# Patient Record
Sex: Female | Born: 1980 | Race: White | Hispanic: No | Marital: Single | Smoking: Never smoker
Health system: Southern US, Community
[De-identification: ages and names within clinical notes are randomized; demographics above are authoritative.]

## PROBLEM LIST (undated history)

## (undated) ENCOUNTER — Inpatient Hospital Stay (HOSPITAL_COMMUNITY): Payer: Self-pay

## (undated) DIAGNOSIS — D649 Anemia, unspecified: Secondary | ICD-10-CM

## (undated) DIAGNOSIS — N390 Urinary tract infection, site not specified: Secondary | ICD-10-CM

## (undated) DIAGNOSIS — R87629 Unspecified abnormal cytological findings in specimens from vagina: Secondary | ICD-10-CM

## (undated) HISTORY — DX: Urinary tract infection, site not specified: N39.0

## (undated) HISTORY — PX: NO PAST SURGERIES: SHX2092

---

## 2014-04-17 DIAGNOSIS — R87629 Unspecified abnormal cytological findings in specimens from vagina: Secondary | ICD-10-CM

## 2014-04-17 HISTORY — DX: Unspecified abnormal cytological findings in specimens from vagina: R87.629

## 2015-04-01 LAB — OB RESULTS CONSOLE ABO/RH: RH TYPE: POSITIVE

## 2015-04-01 LAB — OB RESULTS CONSOLE RPR: RPR: NONREACTIVE

## 2015-04-01 LAB — OB RESULTS CONSOLE GC/CHLAMYDIA
Chlamydia: NEGATIVE
Gonorrhea: NEGATIVE

## 2015-04-01 LAB — OB RESULTS CONSOLE HIV ANTIBODY (ROUTINE TESTING): HIV: NONREACTIVE

## 2015-04-01 LAB — OB RESULTS CONSOLE ANTIBODY SCREEN: ANTIBODY SCREEN: NEGATIVE

## 2015-04-01 LAB — OB RESULTS CONSOLE RUBELLA ANTIBODY, IGM: RUBELLA: IMMUNE

## 2015-04-01 LAB — OB RESULTS CONSOLE HEPATITIS B SURFACE ANTIGEN: HEP B S AG: NEGATIVE

## 2015-04-16 ENCOUNTER — Ambulatory Visit (HOSPITAL_COMMUNITY)
Admission: RE | Admit: 2015-04-16 | Discharge: 2015-04-16 | Disposition: A | Discharge status: Home / Self Care | Payer: Medicaid Other | Source: Ambulatory Visit | Attending: Nurse Practitioner | Admitting: Nurse Practitioner

## 2015-04-16 ENCOUNTER — Other Ambulatory Visit (HOSPITAL_COMMUNITY): Payer: Self-pay | Admitting: Nurse Practitioner

## 2015-04-16 ENCOUNTER — Encounter (HOSPITAL_COMMUNITY): Payer: Self-pay

## 2015-04-16 DIAGNOSIS — Z3A12 12 weeks gestation of pregnancy: Secondary | ICD-10-CM

## 2015-04-16 DIAGNOSIS — Z3A13 13 weeks gestation of pregnancy: Secondary | ICD-10-CM | POA: Diagnosis not present

## 2015-04-16 DIAGNOSIS — Z3682 Encounter for antenatal screening for nuchal translucency: Secondary | ICD-10-CM

## 2015-04-16 DIAGNOSIS — Z36 Encounter for antenatal screening of mother: Secondary | ICD-10-CM | POA: Insufficient documentation

## 2015-04-18 NOTE — L&D Delivery Note (Signed)
Delivery Note At 5:14 PM a viable female was delivered via  (Presentation: ROA).  APGAR: 8, 9; weight pending Placenta status: Intact, Spontaneous.  Cord:  with the following complications: none.  Anesthesia:  epidural Episiotomy:  none Lacerations: 1st degree;Labial Suture Repair: none Est. Blood Loss (mL):  100mL  Mom to postpartum.  Baby to Couplet care / Skin to Skin.    Loni MuseKate Timberlake 10/25/2015, 5:41 PM   I was present for this delivery and agree with the above resident's note.  LEFTWICH-KIRBY, Atianna Haidar Certified Nurse-Midwife

## 2015-04-20 ENCOUNTER — Encounter (HOSPITAL_COMMUNITY): Payer: Self-pay

## 2015-04-30 ENCOUNTER — Other Ambulatory Visit (HOSPITAL_COMMUNITY): Payer: Self-pay

## 2015-05-28 ENCOUNTER — Inpatient Hospital Stay (HOSPITAL_COMMUNITY)
Admission: AD | Admit: 2015-05-28 | Discharge: 2015-05-28 | Disposition: A | Discharge status: Home / Self Care | Payer: Medicaid Other | Source: Ambulatory Visit | Attending: Family Medicine | Admitting: Family Medicine

## 2015-05-28 ENCOUNTER — Encounter (HOSPITAL_COMMUNITY): Payer: Self-pay | Admitting: *Deleted

## 2015-05-28 DIAGNOSIS — R42 Dizziness and giddiness: Secondary | ICD-10-CM | POA: Diagnosis present

## 2015-05-28 DIAGNOSIS — Z3A19 19 weeks gestation of pregnancy: Secondary | ICD-10-CM | POA: Insufficient documentation

## 2015-05-28 DIAGNOSIS — D649 Anemia, unspecified: Secondary | ICD-10-CM

## 2015-05-28 DIAGNOSIS — O99012 Anemia complicating pregnancy, second trimester: Secondary | ICD-10-CM

## 2015-05-28 HISTORY — DX: Anemia, unspecified: D64.9

## 2015-05-28 HISTORY — DX: Unspecified abnormal cytological findings in specimens from vagina: R87.629

## 2015-05-28 LAB — CBC
HEMATOCRIT: 27.3 % — AB (ref 36.0–46.0)
HEMOGLOBIN: 8.4 g/dL — AB (ref 12.0–15.0)
MCH: 23.7 pg — ABNORMAL LOW (ref 26.0–34.0)
MCHC: 30.8 g/dL (ref 30.0–36.0)
MCV: 76.9 fL — ABNORMAL LOW (ref 78.0–100.0)
Platelets: 197 10*3/uL (ref 150–400)
RBC: 3.55 MIL/uL — ABNORMAL LOW (ref 3.87–5.11)
RDW: 15.3 % (ref 11.5–15.5)
WBC: 6.9 10*3/uL (ref 4.0–10.5)

## 2015-05-28 LAB — COMPREHENSIVE METABOLIC PANEL
ALBUMIN: 3.1 g/dL — AB (ref 3.5–5.0)
ALK PHOS: 39 U/L (ref 38–126)
ALT: 12 U/L — ABNORMAL LOW (ref 14–54)
ANION GAP: 5 (ref 5–15)
AST: 19 U/L (ref 15–41)
BILIRUBIN TOTAL: 0.3 mg/dL (ref 0.3–1.2)
BUN: 9 mg/dL (ref 6–20)
CALCIUM: 8.2 mg/dL — AB (ref 8.9–10.3)
CO2: 25 mmol/L (ref 22–32)
Chloride: 105 mmol/L (ref 101–111)
Creatinine, Ser: 0.49 mg/dL (ref 0.44–1.00)
GFR calc Af Amer: >60 mL/min (ref >60)
GFR calc non Af Amer: >60 mL/min (ref >60)
GLUCOSE: 111 mg/dL — AB (ref 65–99)
Potassium: 3.7 mmol/L (ref 3.5–5.1)
Sodium: 135 mmol/L (ref 135–145)
TOTAL PROTEIN: 6.4 g/dL — AB (ref 6.5–8.1)

## 2015-05-28 LAB — URINALYSIS, ROUTINE W REFLEX MICROSCOPIC
Bilirubin Urine: NEGATIVE
GLUCOSE, UA: NEGATIVE mg/dL
Hgb urine dipstick: NEGATIVE
Ketones, ur: 15 mg/dL — AB
LEUKOCYTES UA: NEGATIVE
Nitrite: NEGATIVE
PROTEIN: NEGATIVE mg/dL
SPECIFIC GRAVITY, URINE: 1.025 (ref 1.005–1.030)
pH: 6 (ref 5.0–8.0)

## 2015-05-28 MED ORDER — SODIUM CHLORIDE 0.9 % IV SOLN
INTRAVENOUS | Status: DC
  Administered 2015-05-28: 12:00:00 via INTRAVENOUS

## 2015-05-28 MED ORDER — FERROUS SULFATE 325 (65 FE) MG PO TABS: 325.0000 mg | ORAL_TABLET | Freq: Two times a day (BID) | ORAL | Status: DC

## 2015-05-28 MED ORDER — SODIUM CHLORIDE 0.9 % IV SOLN
510.0000 mg | Freq: Once | INTRAVENOUS | Status: AC
  Administered 2015-05-28: 510 mg via INTRAVENOUS
  Filled 2015-05-28: qty 17

## 2015-05-28 NOTE — Discharge Instructions (Signed)
Pregnancy and Anemia Anemia is a condition in which the concentration of red blood cells or hemoglobin in the blood is below normal. Hemoglobin is a substance in red blood cells that carries oxygen to the tissues of the body. Anemia results in not enough oxygen reaching these tissues.  Anemia during pregnancy is common because the fetus uses more iron and folic acid as it is developing. Your body may not produce enough red blood cells because of this. Also, during pregnancy, the liquid part of the blood (plasma) increases by about 50%, and the red blood cells increase by only 25%. This lowers the concentration of the red blood cells and creates a natural anemia-like situation.  CAUSES  The most common cause of anemia during pregnancy is not having enough iron in the body to make red blood cells (iron deficiency anemia). Other causes may include:  Folic acid deficiency.  Vitamin B12 deficiency.  Certain prescription or over-the-counter medicines.  Certain medical conditions or infections that destroy red blood cells.  A low platelet count and bleeding caused by antibodies that go through the placenta to the fetus from the mother's blood. SIGNS AND SYMPTOMS  Mild anemia may not be noticeable. If it becomes severe, symptoms may include:  Tiredness.  Shortness of breath, especially with exercise.  Weakness.  Fainting.  Pale looking skin.  Headaches.  Feeling a fast or irregular heartbeat (palpitations). DIAGNOSIS  The type of anemia is usually diagnosed from your family and medical history and blood tests. TREATMENT  Treatment of anemia during pregnancy depends on the cause of the anemia. Treatment can include:  Supplements of iron, vitamin B12, or folic acid.  A blood transfusion. This may be needed if blood loss is severe.  Hospitalization. This may be needed if there is significant continual blood loss.  Dietary changes. HOME CARE INSTRUCTIONS   Follow your dietitian's or  health care provider's dietary recommendations.  Increase your vitamin C intake. This will help the stomach absorb more iron.  Eat a diet rich in iron. This would include foods such as:  Liver.  Beef.  Whole grain bread.  Eggs.  Dried fruit.  Take iron and vitamins as directed by your health care provider.  Eat green leafy vegetables. These are a good source of folic acid. SEEK MEDICAL CARE IF:   You have frequent or lasting headaches.  You are looking pale.  You are bruising easily. SEEK IMMEDIATE MEDICAL CARE IF:   You have extreme weakness, shortness of breath, or chest pain.  You become dizzy or have trouble concentrating.  You have heavy vaginal bleeding.  You develop a rash.  You have bloody or black, tarry stools.  You faint.  You vomit up blood.  You vomit repeatedly.  You have abdominal pain.  You have a fever or persistent symptoms for more than 2-3 days.  You have a fever and your symptoms suddenly get worse.  You are dehydrated. MAKE SURE YOU:   Understand these instructions.  Will watch your condition.  Will get help right away if you are not doing well or get worse.   This information is not intended to replace advice given to you by your health care provider. Make sure you discuss any questions you have with your health care provider.   Document Released: 03/31/2000 Document Revised: 01/22/2013 Document Reviewed: 11/13/2012 Elsevier Interactive Patient Education 2016 [employer]    Safe Medications in Pregnancy   Acne: Benzoyl Peroxide Salicylic Acid  Backache/Headache: Tylenol: 2 regular  strength every 4 hours OR              2 Extra strength every 6 hours  Colds/Coughs/Allergies: Benadryl (alcohol free) 25 mg every 6 hours as needed Breath right strips Claritin Cepacol throat lozenges Chloraseptic throat spray Cold-Eeze- up to three times per day Cough drops, alcohol free Flonase (by prescription  only) Guaifenesin Mucinex Robitussin DM (plain only, alcohol free) Saline nasal spray/drops Sudafed (pseudoephedrine) & Actifed ** use only after [redacted] weeks gestation and if you do not have high blood pressure Tylenol Vicks Vaporub Zinc lozenges Zyrtec   Constipation: Colace Ducolax suppositories Fleet enema Glycerin suppositories Metamucil Milk of magnesia Miralax Senokot Smooth move tea  Diarrhea: Kaopectate Imodium A-D  *NO pepto Bismol  Hemorrhoids: Anusol Anusol HC Preparation H Tucks  Indigestion: Tums Maalox Mylanta Zantac  Pepcid  Insomnia: Benadryl (alcohol free)  every 6 hours as needed Tylenol PM Unisom, no Gelcaps  Leg Cramps: Tums MagGel  Nausea/Vomiting:  Bonine Dramamine Emetrol Ginger extract Sea bands Meclizine  Nausea medication to take during pregnancy:  Unisom (doxylamine succinate 25 mg tablets) Take one tablet daily at bedtime. If symptoms are not adequately controlled, the dose can be increased to a maximum recommended dose of two tablets daily (1/2 tablet in the morning, 1/2 tablet mid-afternoon and one at bedtime). Vitamin B6  tablets. Take one tablet twice a day (up to 200 mg per day).  Skin Rashes: Aveeno products Benadryl cream or  every 6 hours as needed Calamine Lotion 1% cortisone cream  Yeast infection: Gyne-lotrimin 7 Monistat 7  Gum/tooth pain: Anbesol  **If taking multiple medications, please check labels to avoid duplicating the same active ingredients **take medication as directed on the label ** Do not exceed 4000 mg of tylenol in 24 hours **Do not take medications that contain aspirin or ibuprofen

## 2015-05-28 NOTE — MAU Provider Note (Signed)
History     CSN: [id number]  Arrival date and time: 05/28/15 1012   First Provider Initiated Contact with Patient 05/28/15 1053      Chief Complaint  Patient presents with  . Dizziness    HPI  Jillian Barr is a 35 y.o. [id number] at [redacted]w[redacted]d who presents with dizziness. Symptoms started this morning while sitting down. Felt light headed, short of breath, and felt like heart was racing. Still feels light headed but other symptoms have resolved. Denies chest pain, abdominal pain, vaginal bleeding.  History of anemia in previous pregnancy, currently not treating.  Hx of anemia, no blood transfusion   OB History    Gravida Para Term Preterm AB TAB SAB Ectopic Multiple Living   0 1 0 1 0 0 4      Past Medical History  Diagnosis Date  . Medical history non-contributory     Past Surgical History  Procedure Laterality Date  . No past surgeries      History reviewed. No pertinent family history.  Social History  Substance Use Topics  . Smoking status: Never Smoker   . Smokeless tobacco: None  . Alcohol Use: No    Allergies: No Known Allergies  Prescriptions prior to admission  Medication Sig Dispense Refill Last Dose  . acetaminophen (TYLENOL) 325 MG tablet Take 650-975 mg by mouth every 6 (six) hours as needed for mild pain or headache.   05/27/2015 at Unknown time  . Prenatal Vit-Fe Fumarate-FA (PRENATAL MULTIVITAMIN) TABS tablet Take 1 tablet by mouth daily.    05/27/2015 at Unknown time    Review of Systems  Constitutional: Negative for fever and chills.  HENT: Positive for congestion.   Respiratory: Positive for shortness of breath (this morning). Negative for cough.   Cardiovascular: Negative for chest pain.  Gastrointestinal: Negative.   Genitourinary: Negative.   Neurological: Positive for dizziness. Negative for loss of consciousness, weakness and headaches.   Physical Exam   Blood pressure 98/62, pulse 94, temperature 97.7 F (36.5 C), temperature  source Oral, resp. rate 16, last menstrual period 12/28/2014.  Patient Vitals for the past 24 hrs:  BP Temp Temp src Pulse Resp  05/28/15 1049 98/62 mmHg - - 94 -  05/28/15 1047 107/56 mmHg - - 93 -  05/28/15 1045 106/60 mmHg - - 87 -  05/28/15 1044 101/61 mmHg - - 83 16  05/28/15 1024 107/64 mmHg 97.7 F (36.5 C) Oral 94 18     Physical Exam  Nursing note and vitals reviewed. Constitutional: She is oriented to person, place, and time. She appears well-developed and well-nourished. No distress.  HENT:  Head: Normocephalic and atraumatic.  Mouth/Throat: Uvula is midline and oropharynx is clear and moist. Mucous membranes are pale and dry.  Moderate amount of cerumen bilaterally. Difficult to visualize TM.   Eyes: Conjunctivae are normal. Right eye exhibits no discharge. Left eye exhibits no discharge. No scleral icterus.  Neck: Normal range of motion.  Cardiovascular: Normal rate, regular rhythm and normal heart sounds.   No murmur heard. Respiratory: Effort normal and breath sounds normal. No respiratory distress. She has no wheezes.  GI: Soft. There is no tenderness.  Neurological: She is alert and oriented to person, place, and time.  Skin: Skin is warm and dry. She is not diaphoretic.  Psychiatric: She has a normal mood and affect. Her behavior is normal. Judgment and thought content normal.    MAU Course  Procedures Results for orders placed or performed  during the hospital encounter of 05/28/15 (from the past 24 hour(s))  Urinalysis, Routine w reflex microscopic (not at [facility])     Status: Abnormal   Collection Time: 05/28/15 10:15 AM  Result Value Ref Range   Color, Urine YELLOW YELLOW   APPearance HAZY (A) CLEAR   Specific Gravity, Urine 1.025 1.005 - 1.030   pH 6.0 5.0 - 8.0   Glucose, UA NEGATIVE NEGATIVE mg/dL   Hgb urine dipstick NEGATIVE NEGATIVE   Bilirubin Urine NEGATIVE NEGATIVE   Ketones, ur 15 (A) NEGATIVE mg/dL   Protein, ur NEGATIVE NEGATIVE mg/dL    Nitrite NEGATIVE NEGATIVE   Leukocytes, UA NEGATIVE NEGATIVE  CBC     Status: Abnormal   Collection Time: 05/28/15 11:03 AM  Result Value Ref Range   WBC 6.9 4.0 - 10.5 K/uL   RBC 3.55 (L) 3.87 - 5.11 MIL/uL   Hemoglobin 8.4 (L) 12.0 - 15.0 g/dL   HCT 45.4 (L) 09.8 - 11.9 %   MCV 76.9 (L) 78.0 - 100.0 fL   MCH 23.7 (L) 26.0 - 34.0 pg   MCHC 30.8 30.0 - 36.0 g/dL   RDW 14.7 82.9 - 56.2 %   Platelets 197 150 - 400 K/uL  Comprehensive metabolic panel     Status: Abnormal   Collection Time: 05/28/15 11:03 AM  Result Value Ref Range   Sodium 135 135 - 145 mmol/L   Potassium 3.7 3.5 - 5.1 mmol/L   Chloride 105 101 - 111 mmol/L   CO2 25 22 - 32 mmol/L   Glucose, Bld 111 (H) 65 - 99 mg/dL   BUN 9 6 - 20 mg/dL   Creatinine, Ser 1.30 0.44 - 1.00 mg/dL   Calcium 8.2 (L) 8.9 - 10.3 mg/dL   Total Protein 6.4 (L) 6.5 - 8.1 g/dL   Albumin 3.1 (L) 3.5 - 5.0 g/dL   AST 19 15 - 41 U/L   ALT 12 (L) 14 - 54 U/L   Alkaline Phosphatase 39 38 - 126 U/L   Total Bilirubin 0.3 0.3 - 1.2 mg/dL   GFR calc non Af Amer >60 >60 mL/min   GFR calc Af Amer >60 >60 mL/min   Anion gap 5 5 - 15    MDM FHT 144 by doppler Orthostatic VS normal C/w Dr. Excell Seltzer with cardiology - EKG reviewed, normal IV feraheme 540 mg Assessment and Plan  A: 1. Anemia affecting pregnancy in second trimester     P: Discharge home Rx ferrous sulfate BID Anemia & iron rich diet education given Keep f/u with health dept  Judeth Horn 05/28/2015, 10:59 AM

## 2015-05-28 NOTE — MAU Note (Signed)
Pt came in by EMS, C/O lightheadedness & dizziness that started this a.m., is unable to move around without dizziness, feels like heart is racing.  Denies pain, no bleeding or discharge.

## 2015-06-05 ENCOUNTER — Emergency Department (INDEPENDENT_AMBULATORY_CARE_PROVIDER_SITE_OTHER)
Admission: EM | Admit: 2015-06-05 | Discharge: 2015-06-05 | Disposition: A | Discharge status: Home / Self Care | Payer: Medicaid Other | Source: Home / Self Care | Attending: Emergency Medicine | Admitting: Emergency Medicine

## 2015-06-05 ENCOUNTER — Encounter (HOSPITAL_COMMUNITY): Payer: Self-pay | Admitting: Emergency Medicine

## 2015-06-05 DIAGNOSIS — L237 Allergic contact dermatitis due to plants, except food: Secondary | ICD-10-CM | POA: Diagnosis not present

## 2015-06-05 MED ORDER — PREDNISONE 20 MG PO TABS: ORAL_TABLET | ORAL | Status: DC

## 2015-06-05 MED ORDER — METHYLPREDNISOLONE ACETATE 40 MG/ML IJ SUSP
INTRAMUSCULAR | Status: AC
  Filled 2015-06-05: qty 1

## 2015-06-05 MED ORDER — METHYLPREDNISOLONE ACETATE 40 MG/ML IJ SUSP
40.0000 mg | Freq: Once | INTRAMUSCULAR | Status: AC
  Administered 2015-06-05: 40 mg via INTRAMUSCULAR

## 2015-06-05 NOTE — ED Notes (Signed)
The patient presented to the [facility] with a rash on her face and neck that started yesterday.

## 2015-06-05 NOTE — ED Provider Notes (Signed)
CSN: [id number]     Arrival date & time 06/05/15  1836 History   First MD Initiated Contact with Patient 06/05/15 1856     Chief Complaint  Patient presents with  . Rash   (Consider location/radiation/quality/duration/timing/severity/associated sxs/prior Treatment) HPI  She is a 35 year old woman here for evaluation of rash. Of note she is 5 months pregnant. She states the rash started Thursday night with a small spot by her right eye. She also noticed it on her right wrist. It has gradually spread to involve the right side of her face. It is very itchy. She states she was exposed to poison ivy.  Denies any throat itching or difficulty breathing.  Past Medical History  Diagnosis Date  . Anemia   . Vaginal Pap smear, abnormal 2016   Past Surgical History  Procedure Laterality Date  . No past surgeries     Family History  Problem Relation Age of Onset  . Adopted: Yes  . Family history unknown: Yes   Social History  Substance Use Topics  . Smoking status: Never Smoker   . Smokeless tobacco: None  . Alcohol Use: No   OB History    Gravida Para Term Preterm AB TAB SAB Ectopic Multiple Living   0 1 0 1 0 0 4     Review of Systems As in history of present illness Allergies  Review of patient's allergies indicates no known allergies.  Home Medications   Prior to Admission medications   Medication Sig Start Date End Date Taking? Authorizing Provider  ferrous sulfate 325 (65 FE) MG tablet Take 1 tablet (325 mg total) by mouth 2 (two) times daily. 05/28/15  Yes Judeth Horn, NP  Prenatal Vit-Fe Fumarate-FA (PRENATAL MULTIVITAMIN) TABS tablet Take 1 tablet by mouth daily.    Yes Historical Provider, MD  acetaminophen (TYLENOL) 325 MG tablet Take 650-975 mg by mouth every 6 (six) hours as needed for mild pain or headache.    Historical Provider, MD  predniSONE (DELTASONE) 20 MG tablet Take 3 tablets daily for 3 days, then 2 tablets for 3 days, then 1 tablet for 3 days, then  1/2 tablet for 3 days. 06/05/15   Charm Rings, MD   Meds Ordered and Administered this Visit   Medications  methylPREDNISolone acetate (DEPO-MEDROL) injection 40 mg (not administered)    BP 118/74 mmHg  Pulse 87  Temp(Src) 98.6 F (37 C) (Oral)  SpO2 100%  LMP 12/28/2014 No data found.   Physical Exam  Constitutional: She is oriented to person, place, and time. She appears well-developed and well-nourished.  HENT:  Mouth/Throat: Oropharynx is clear and moist.  Cardiovascular: Normal rate.   Pulmonary/Chest: Effort normal.  Neurological: She is alert and oriented to person, place, and time.  Skin: Rash (she has an erythematous rash with a few papules involving her right cheek, chin, and right neck.) noted.  She also has a few papules on the right wrist.    ED Course  Procedures (including critical care time)  Labs Review Labs Reviewed - No data to display  Imaging Review No results found.    MDM   1. Poison ivy dermatitis    Depo-Medrol given here. Treat with a prednisone taper. Follow-up as needed.    Charm Rings, MD 06/05/15 [phone]

## 2015-06-05 NOTE — Discharge Instructions (Signed)
You have poison ivy on your face. Take the prednisone taper as prescribed. You can take Benadryl as needed for itching. Follow-up as needed.

## 2015-08-20 ENCOUNTER — Inpatient Hospital Stay (HOSPITAL_COMMUNITY)
Admission: AD | Admit: 2015-08-20 | Payer: Medicaid Other | Source: Ambulatory Visit | Admitting: Obstetrics and Gynecology

## 2015-09-08 ENCOUNTER — Encounter (HOSPITAL_COMMUNITY): Payer: Self-pay | Admitting: *Deleted

## 2015-09-08 ENCOUNTER — Ambulatory Visit (HOSPITAL_COMMUNITY)
Admission: EM | Admit: 2015-09-08 | Discharge: 2015-09-08 | Disposition: A | Discharge status: Home / Self Care | Payer: Medicaid Other | Attending: Family Medicine | Admitting: Family Medicine

## 2015-09-08 DIAGNOSIS — O26899 Other specified pregnancy related conditions, unspecified trimester: Secondary | ICD-10-CM

## 2015-09-08 DIAGNOSIS — G5600 Carpal tunnel syndrome, unspecified upper limb: Secondary | ICD-10-CM | POA: Diagnosis not present

## 2015-09-08 NOTE — ED Notes (Signed)
Patient reports increased pain to bilateral wrists radiating into hands, with increased numbness and tingling. Patient also reports pain to lower back radiating into left leg. Patient is [redacted] weeks pregnant.

## 2015-09-08 NOTE — Discharge Instructions (Signed)
Wear splints at night. See orthopedist if further problems.

## 2015-09-08 NOTE — ED Provider Notes (Signed)
CSN: [id number]     Arrival date & time 09/08/15  1751 History   First MD Initiated Contact with Patient 09/08/15 1818     Chief Complaint  Patient presents with  . Hand Pain  . Back Pain   (Consider location/radiation/quality/duration/timing/severity/associated sxs/prior Treatment) Patient is a 35 y.o. female presenting with hand pain and back pain. The history is provided by the patient.  Hand Pain This is a new problem. The current episode started more than 2 days ago (35 wk preg woman with bilat cts sx.). The problem has not changed since onset.The symptoms are aggravated by bending.  Back Pain   Past Medical History  Diagnosis Date  . Anemia   . Vaginal Pap smear, abnormal 2016   Past Surgical History  Procedure Laterality Date  . No past surgeries     Family History  Problem Relation Age of Onset  . Adopted: Yes  . Family history unknown: Yes   Social History  Substance Use Topics  . Smoking status: Never Smoker   . Smokeless tobacco: None  . Alcohol Use: No   OB History    Gravida Para Term Preterm AB TAB SAB Ectopic Multiple Living   6 4 4  0 1 0 1 0 0 4     Review of Systems  Constitutional: Negative.   Musculoskeletal: Positive for back pain and joint swelling.  Skin: Negative.   All other systems reviewed and are negative.   Allergies  Review of patient's allergies indicates no known allergies.  Home Medications   Prior to Admission medications   Medication Sig Start Date End Date Taking? Authorizing Provider  acetaminophen (TYLENOL) 325 MG tablet Take 650-975 mg by mouth every 6 (six) hours as needed for mild pain or headache.   Yes Historical Provider, MD  ferrous sulfate 325 (65 FE) MG tablet Take 1 tablet (325 mg total) by mouth 2 (two) times daily. 05/28/15  Yes Judeth HornErin Lawrence, NP  Prenatal Vit-Fe Fumarate-FA (PRENATAL MULTIVITAMIN) TABS tablet Take 1 tablet by mouth daily.    Yes Historical Provider, MD  predniSONE (DELTASONE) 20 MG tablet Take 3  tablets daily for 3 days, then 2 tablets for 3 days, then 1 tablet for 3 days, then 1/2 tablet for 3 days. 06/05/15   Charm RingsErin J Honig, MD   Meds Ordered and Administered this Visit  Medications - No data to display  BP 122/76 mmHg  Pulse 79  Temp(Src) 97.6 F (36.4 C) (Oral)  Resp 17  SpO2 100%  LMP 12/28/2014 No data found.   Physical Exam  Constitutional: She is oriented to person, place, and time. She appears well-developed and well-nourished.  Neurological: She is alert and oriented to person, place, and time.  Pos tinels bilat with finger neuralgia.  Skin: Skin is warm and dry.  Nursing note and vitals reviewed.   ED Course  Procedures (including critical care time)  Labs Review Labs Reviewed - No data to display  Imaging Review No results found.   Visual Acuity Review  Right Eye Distance:   Left Eye Distance:   Bilateral Distance:    Right Eye Near:   Left Eye Near:    Bilateral Near:         MDM   1. Carpal tunnel syndrome during pregnancy        Linna HoffJames D Joan Herschberger, MD 09/08/15 [phone]

## 2015-09-20 LAB — OB RESULTS CONSOLE GC/CHLAMYDIA
Chlamydia: NEGATIVE
Gonorrhea: NEGATIVE

## 2015-09-20 LAB — OB RESULTS CONSOLE GBS: STREP GROUP B AG: NEGATIVE

## 2015-10-20 ENCOUNTER — Telehealth (HOSPITAL_COMMUNITY): Payer: Self-pay | Admitting: *Deleted

## 2015-10-20 ENCOUNTER — Encounter (HOSPITAL_COMMUNITY): Payer: Self-pay | Admitting: *Deleted

## 2015-10-20 NOTE — Telephone Encounter (Signed)
Preadmission screen  

## 2015-10-25 ENCOUNTER — Inpatient Hospital Stay (HOSPITAL_COMMUNITY)
Admission: RE | Admit: 2015-10-25 | Discharge: 2015-10-27 | DRG: 775 | Disposition: A | Discharge status: Home / Self Care | Payer: Medicaid Other | Source: Ambulatory Visit | Attending: Obstetrics & Gynecology | Admitting: Obstetrics & Gynecology

## 2015-10-25 ENCOUNTER — Inpatient Hospital Stay (HOSPITAL_COMMUNITY): Payer: Medicaid Other | Admitting: Anesthesiology

## 2015-10-25 ENCOUNTER — Encounter (HOSPITAL_COMMUNITY): Payer: Self-pay

## 2015-10-25 VITALS — BP 122/77 | HR 48 | Temp 98.3°F | Resp 18 | Ht 66.0 in | Wt 190.0 lb

## 2015-10-25 DIAGNOSIS — Z3A41 41 weeks gestation of pregnancy: Secondary | ICD-10-CM | POA: Diagnosis not present

## 2015-10-25 DIAGNOSIS — O480 Post-term pregnancy: Principal | ICD-10-CM | POA: Diagnosis present

## 2015-10-25 LAB — CBC
HEMATOCRIT: 30.5 % — AB (ref 36.0–46.0)
Hemoglobin: 9.7 g/dL — ABNORMAL LOW (ref 12.0–15.0)
MCH: 24.7 pg — AB (ref 26.0–34.0)
MCHC: 31.8 g/dL (ref 30.0–36.0)
MCV: 77.8 fL — AB (ref 78.0–100.0)
PLATELETS: 143 10*3/uL — AB (ref 150–400)
RBC: 3.92 MIL/uL (ref 3.87–5.11)
RDW: 16.4 % — ABNORMAL HIGH (ref 11.5–15.5)
WBC: 7.2 10*3/uL (ref 4.0–10.5)

## 2015-10-25 LAB — TYPE AND SCREEN
ABO/RH(D): O POS
Antibody Screen: NEGATIVE

## 2015-10-25 LAB — ABO/RH: ABO/RH(D): O POS

## 2015-10-25 MED ORDER — DIPHENHYDRAMINE HCL 50 MG/ML IJ SOLN: 12.5000 mg | INTRAMUSCULAR | Status: DC | PRN

## 2015-10-25 MED ORDER — PRENATAL MULTIVITAMIN CH
1.0000 | ORAL_TABLET | Freq: Every day | ORAL | Status: DC
  Administered 2015-10-26 – 2015-10-27 (×2): 1 via ORAL
  Filled 2015-10-25 (×2): qty 1

## 2015-10-25 MED ORDER — OXYTOCIN 10 UNIT/ML IJ SOLN: 10.0000 [IU] | Freq: Once | INTRAMUSCULAR | Status: DC

## 2015-10-25 MED ORDER — LIDOCAINE HCL (PF) 1 % IJ SOLN
30.0000 mL | INTRAMUSCULAR | Status: DC | PRN
  Filled 2015-10-25: qty 30

## 2015-10-25 MED ORDER — ACETAMINOPHEN 325 MG PO TABS: 650.0000 mg | ORAL_TABLET | ORAL | Status: DC | PRN

## 2015-10-25 MED ORDER — LIDOCAINE HCL (PF) 1 % IJ SOLN
INTRAMUSCULAR | Status: DC | PRN
  Administered 2015-10-25 (×2): 5 mL

## 2015-10-25 MED ORDER — WITCH HAZEL-GLYCERIN EX PADS: 1.0000 "application " | MEDICATED_PAD | CUTANEOUS | Status: DC | PRN

## 2015-10-25 MED ORDER — SODIUM CHLORIDE 0.9% FLUSH: 3.0000 mL | Freq: Two times a day (BID) | INTRAVENOUS | Status: DC

## 2015-10-25 MED ORDER — LACTATED RINGERS IV SOLN: 500.0000 mL | INTRAVENOUS | Status: DC | PRN

## 2015-10-25 MED ORDER — EPHEDRINE 5 MG/ML INJ
10.0000 mg | INTRAVENOUS | Status: DC | PRN
  Filled 2015-10-25: qty 2

## 2015-10-25 MED ORDER — OXYTOCIN BOLUS FROM INFUSION: 500.0000 mL | INTRAVENOUS | Status: DC

## 2015-10-25 MED ORDER — OXYTOCIN 40 UNITS IN LACTATED RINGERS INFUSION - SIMPLE MED
2.5000 [IU]/h | INTRAVENOUS | Status: DC
  Administered 2015-10-25: 2.5 [IU]/h via INTRAVENOUS

## 2015-10-25 MED ORDER — ZOLPIDEM TARTRATE 5 MG PO TABS: 5.0000 mg | ORAL_TABLET | Freq: Every evening | ORAL | Status: DC | PRN

## 2015-10-25 MED ORDER — TERBUTALINE SULFATE 1 MG/ML IJ SOLN
0.2500 mg | Freq: Once | INTRAMUSCULAR | Status: DC | PRN
  Filled 2015-10-25: qty 1

## 2015-10-25 MED ORDER — SODIUM CHLORIDE 0.9% FLUSH: 3.0000 mL | INTRAVENOUS | Status: DC | PRN

## 2015-10-25 MED ORDER — DIBUCAINE 1 % RE OINT: 1.0000 "application " | TOPICAL_OINTMENT | RECTAL | Status: DC | PRN

## 2015-10-25 MED ORDER — SIMETHICONE 80 MG PO CHEW
80.0000 mg | CHEWABLE_TABLET | ORAL | Status: DC | PRN
Start: 2015-10-25 — End: 2015-10-27

## 2015-10-25 MED ORDER — BENZOCAINE-MENTHOL 20-0.5 % EX AERO
1.0000 "application " | INHALATION_SPRAY | CUTANEOUS | Status: DC | PRN
  Filled 2015-10-25: qty 56

## 2015-10-25 MED ORDER — SOD CITRATE-CITRIC ACID 500-334 MG/5ML PO SOLN: 30.0000 mL | ORAL | Status: DC | PRN

## 2015-10-25 MED ORDER — ONDANSETRON HCL 4 MG/2ML IJ SOLN: 4.0000 mg | INTRAMUSCULAR | Status: DC | PRN

## 2015-10-25 MED ORDER — HYDROXYZINE HCL 50 MG PO TABS
50.0000 mg | ORAL_TABLET | Freq: Four times a day (QID) | ORAL | Status: DC | PRN
  Filled 2015-10-25: qty 1

## 2015-10-25 MED ORDER — SENNOSIDES-DOCUSATE SODIUM 8.6-50 MG PO TABS
2.0000 | ORAL_TABLET | ORAL | Status: DC
  Administered 2015-10-26 (×2): 2 via ORAL
  Filled 2015-10-25 (×2): qty 2

## 2015-10-25 MED ORDER — ZOLPIDEM TARTRATE 5 MG PO TABS
5.0000 mg | ORAL_TABLET | Freq: Every evening | ORAL | Status: DC | PRN
  Administered 2015-10-25: 5 mg via ORAL
  Filled 2015-10-25: qty 1

## 2015-10-25 MED ORDER — LACTATED RINGERS IV SOLN
INTRAVENOUS | Status: DC
  Administered 2015-10-25 (×3): via INTRAVENOUS

## 2015-10-25 MED ORDER — LACTATED RINGERS IV SOLN
500.0000 mL | Freq: Once | INTRAVENOUS | Status: AC
  Administered 2015-10-25: 1000 mL via INTRAVENOUS

## 2015-10-25 MED ORDER — SODIUM CHLORIDE 0.9 % IV SOLN: 250.0000 mL | INTRAVENOUS | Status: DC | PRN

## 2015-10-25 MED ORDER — OXYCODONE-ACETAMINOPHEN 5-325 MG PO TABS: 2.0000 | ORAL_TABLET | ORAL | Status: DC | PRN

## 2015-10-25 MED ORDER — COCONUT OIL OIL: 1.0000 "application " | TOPICAL_OIL | Status: DC | PRN

## 2015-10-25 MED ORDER — MISOPROSTOL 25 MCG QUARTER TABLET
25.0000 ug | ORAL_TABLET | ORAL | Status: DC | PRN
  Administered 2015-10-25: 25 ug via VAGINAL
  Filled 2015-10-25: qty 0.25
  Filled 2015-10-25: qty 1

## 2015-10-25 MED ORDER — ONDANSETRON HCL 4 MG PO TABS: 4.0000 mg | ORAL_TABLET | ORAL | Status: DC | PRN

## 2015-10-25 MED ORDER — FENTANYL CITRATE (PF) 100 MCG/2ML IJ SOLN
50.0000 ug | INTRAMUSCULAR | Status: DC | PRN
  Administered 2015-10-25: 100 ug via INTRAVENOUS
  Filled 2015-10-25: qty 2

## 2015-10-25 MED ORDER — FERROUS SULFATE 325 (65 FE) MG PO TABS
325.0000 mg | ORAL_TABLET | Freq: Two times a day (BID) | ORAL | Status: DC
  Administered 2015-10-26 – 2015-10-27 (×2): 325 mg via ORAL
  Filled 2015-10-25 (×2): qty 1

## 2015-10-25 MED ORDER — DIPHENHYDRAMINE HCL 25 MG PO CAPS: 25.0000 mg | ORAL_CAPSULE | Freq: Four times a day (QID) | ORAL | Status: DC | PRN

## 2015-10-25 MED ORDER — OXYCODONE-ACETAMINOPHEN 5-325 MG PO TABS: 1.0000 | ORAL_TABLET | ORAL | Status: DC | PRN

## 2015-10-25 MED ORDER — PHENYLEPHRINE 40 MCG/ML (10ML) SYRINGE FOR IV PUSH (FOR BLOOD PRESSURE SUPPORT)
80.0000 ug | PREFILLED_SYRINGE | INTRAVENOUS | Status: DC | PRN
  Filled 2015-10-25: qty 10
  Filled 2015-10-25: qty 5

## 2015-10-25 MED ORDER — OXYTOCIN 40 UNITS IN LACTATED RINGERS INFUSION - SIMPLE MED
1.0000 m[IU]/min | INTRAVENOUS | Status: DC
  Administered 2015-10-25: 2 m[IU]/min via INTRAVENOUS
  Filled 2015-10-25: qty 1000

## 2015-10-25 MED ORDER — IBUPROFEN 600 MG PO TABS
600.0000 mg | ORAL_TABLET | Freq: Four times a day (QID) | ORAL | Status: DC
  Administered 2015-10-26 – 2015-10-27 (×7): 600 mg via ORAL
  Filled 2015-10-25 (×7): qty 1

## 2015-10-25 MED ORDER — TETANUS-DIPHTH-ACELL PERTUSSIS 5-2.5-18.5 LF-MCG/0.5 IM SUSP: 0.5000 mL | Freq: Once | INTRAMUSCULAR | Status: DC

## 2015-10-25 MED ORDER — FENTANYL 2.5 MCG/ML BUPIVACAINE 1/10 % EPIDURAL INFUSION (WH - ANES)
14.0000 mL/h | INTRAMUSCULAR | Status: DC | PRN
  Administered 2015-10-25: 14 mL/h via EPIDURAL
  Filled 2015-10-25: qty 125

## 2015-10-25 MED ORDER — PHENYLEPHRINE 40 MCG/ML (10ML) SYRINGE FOR IV PUSH (FOR BLOOD PRESSURE SUPPORT)
80.0000 ug | PREFILLED_SYRINGE | INTRAVENOUS | Status: DC | PRN
  Filled 2015-10-25: qty 5

## 2015-10-25 MED ORDER — ONDANSETRON HCL 4 MG/2ML IJ SOLN: 4.0000 mg | Freq: Four times a day (QID) | INTRAMUSCULAR | Status: DC | PRN

## 2015-10-25 NOTE — Progress Notes (Signed)
Jillian Barr is a 35 y.o. [id number] at 8253w0d by ultrasound admitted for induction of labor due to Post dates. Due date 10/18/2015.  Subjective: Pt with no complaints.  S/p epidural.  Objective: BP 108/59 mmHg  Pulse 75  Temp(Src) 98.1 F (36.7 C) (Oral)  Resp 18  Ht 5\' 6"  (1.676 m)  Wt 190 lb (86.183 kg)  BMI 30.68 kg/m2  SpO2 98%  LMP 12/28/2014      FHT:  FHR: 120's bpm, variability: moderate,  accelerations:  Present,  decelerations:  Absent UC:   regular, every 2-3 minutes SVE:   Dilation: 7 Effacement (%): 90 Station: +1 Exam by:: Harraway S/p AROM  Labs: Lab Results  Component Value Date   WBC 7.2 10/25/2015   HGB 9.7* 10/25/2015   HCT 30.5* 10/25/2015   MCV 77.8* 10/25/2015   PLT 143* 10/25/2015    Assessment / Plan: Induction of labor due to postterm,  progressing well on pitocin  Labor: Progressing on Pitocin, will continue to increase then AROM Fetal Wellbeing:  Category I Pain Control:  Epidural I/D:  n/a Anticipated MOD:  NSVD  HARRAWAY-SMITH, Jillian Barr 10/25/2015, 4:02 PM

## 2015-10-25 NOTE — Anesthesia Procedure Notes (Signed)
Epidural Patient location during procedure: OB  Staffing Anesthesiologist: Jentry Mcqueary Performed by: anesthesiologist   Preanesthetic Checklist Completed: patient identified, site marked, surgical consent, pre-op evaluation, timeout performed, IV checked, risks and benefits discussed and monitors and equipment checked  Epidural Patient position: sitting Prep: DuraPrep Patient monitoring: heart rate, continuous pulse ox and blood pressure Approach: right paramedian Location: L3-L4 Injection technique: LOR saline  Needle:  Needle type: Tuohy  Needle gauge: 17 G Needle length: 9 cm and 9 Needle insertion depth: 5 cm Catheter type: closed end flexible Catheter size: 20 Guage Catheter at skin depth: 10 cm Test dose: negative  Assessment Events: blood not aspirated, injection not painful, no injection resistance, negative IV test and no paresthesia  Additional Notes Patient identified. Risks/Benefits/Options discussed with patient including but not limited to bleeding, infection, nerve damage, paralysis, failed block, incomplete pain control, headache, blood pressure changes, nausea, vomiting, reactions to medication both or allergic, itching and postpartum back pain. Confirmed with bedside nurse the patient's most recent platelet count. Confirmed with patient that they are not currently taking any anticoagulation, have any bleeding history or any family history of bleeding disorders. Patient expressed understanding and wished to proceed. All questions were answered. Sterile technique was used throughout the entire procedure. Please see nursing notes for vital signs. Test dose was given through epidural needle and negative prior to continuing to dose epidural or start infusion. Warning signs of high block given to the patient including shortness of breath, tingling/numbness in hands, complete motor block, or any concerning symptoms with instructions to call for help. Patient was given  instructions on fall risk and not to get out of bed. All questions and concerns addressed with instructions to call with any issues.   

## 2015-10-25 NOTE — H&P (Signed)
Jillian Barr is a 35 y.o. female [phone] @ 41 wks presenting for IOL for post dates. Maternal Medical History:  Reason for admission: IOL for post dates  Contractions: none  Fetal activity: Perceived fetal activity is normal.   Last perceived fetal movement was within the past hour.    Prenatal complications: no prenatal complications Prenatal Complications - Diabetes: none.    OB History    Gravida Para Term Preterm AB TAB SAB Ectopic Multiple Living   6 4 4  0 1 0 1 0 0 4     Past Medical History  Diagnosis Date  . Anemia   . Vaginal Pap smear, abnormal 2016  . Frequent UTI    Past Surgical History  Procedure Laterality Date  . No past surgeries     Family History: She was adopted. Family history is unknown by patient. Social History:  reports that she has never smoked. She does not have any smokeless tobacco history on file. She reports that she does not drink alcohol or use illicit drugs.   Prenatal Transfer Tool  Maternal Diabetes: No Genetic Screening: Normal Maternal Ultrasounds/Referrals: Normal Fetal Ultrasounds or other Referrals:  None Maternal Substance Abuse:  No Significant Maternal Medications:  None Significant Maternal Lab Results:  None Other Comments:  None  Review of Systems  Constitutional: Negative.   HENT: Negative.   Eyes: Negative.   Respiratory: Negative.   Cardiovascular: Negative.   Gastrointestinal: Negative.   Genitourinary: Negative.   Musculoskeletal: Negative.   Skin: Negative.   Neurological: Negative.   Endo/Heme/Allergies: Negative.   Psychiatric/Behavioral: Negative.       Blood pressure 115/68, pulse 69, temperature 97.9 F (36.6 C), temperature source Oral, resp. rate 16, height 5\' 6"  (1.676 m), weight 190 lb (86.183 kg), last menstrual period 12/28/2014. Maternal Exam:  Uterine Assessment: none  Abdomen: Patient reports no abdominal tenderness. Fetal presentation: vertex  Introitus: Normal vulva. Normal vagina.   Amniotic fluid character: not assessed.  Pelvis: adequate for delivery.   Cervix: Cervix evaluated by sterile speculum exam and digital exam.     Fetal Exam Fetal Monitor Review: Mode: ultrasound.   Variability: moderate (6-25 bpm).    Fetal State Assessment: Category I - tracings are normal.     Physical Exam  Constitutional: She is oriented to person, place, and time. She appears well-developed and well-nourished.  HENT:  Head: Normocephalic.  Neck: Normal range of motion.  Cardiovascular: Normal rate, regular rhythm and normal heart sounds.   Respiratory: Effort normal and breath sounds normal.  GI: Soft. Bowel sounds are normal.  Genitourinary: Vagina normal and uterus normal.  Musculoskeletal: Normal range of motion.  Neurological: She is alert and oriented to person, place, and time. She has normal reflexes.  Skin: Skin is warm and dry.  Psychiatric: She has a normal mood and affect. Her behavior is normal. Judgment and thought content normal.    Prenatal labs: ABO, Rh: O/Positive/-- (12/15 0000) Antibody: Negative (12/15 0000) Rubella: Immune (12/15 0000) RPR: Nonreactive (12/15 0000)  HBsAg: Negative (12/15 0000)  HIV: Non-reactive (12/15 0000)  GBS: Negative (06/05 0000)   Assessment/Plan: Admit. IOL for post dates. SVE 1/th/post/high. Vertex. GBS neg Plan: cytotec x 1 then pitocin induction of labor   Jillian Barr 10/25/2015, 12:52 AM

## 2015-10-25 NOTE — Anesthesia Preprocedure Evaluation (Signed)

## 2015-10-25 NOTE — Anesthesia Pain Management Evaluation Note (Signed)
  CRNA Pain Management Visit Note  Patient: Jillian Barr, 35 y.o., female  "Hello I am a member of the anesthesia team at [facility]. We have an anesthesia team available at all times to provide care throughout the hospital, including epidural management and anesthesia for C-section. I don't know your plan for the delivery whether it a natural birth, water birth, IV sedation, nitrous supplementation, doula or epidural, but we want to meet your pain goals."   1.Was your pain managed to your expectations on prior hospitalizations?   Yes   2.What is your expectation for pain management during this hospitalization?     Epidural  3.How can we help you reach that goal? Epidural.  Record the patient's initial score and the patient's pain goal.   Pain: 3  Pain Goal: 6 [facility] wants you to be able to say your pain was always managed very well.  Karim Aiello L 10/25/2015

## 2015-10-25 NOTE — Progress Notes (Signed)
LABOR PROGRESS NOTE  Jillian Barr is a 35 y.o. [id number] at 2815w0d  admitted for IOL for post dates.   Subjective: Pt resting comfortably.   Objective: BP 117/76 mmHg  Pulse 74  Temp(Src) 97.9 F (36.6 C) (Oral)  Resp 20  Ht 5\' 6"  (1.676 m)  Wt 86.183 kg (190 lb)  BMI 30.68 kg/m2  LMP 12/28/2014 or  Filed Vitals:   10/25/15 0805 10/25/15 0831 10/25/15 0930 10/25/15 1000  BP: 115/73 116/72 117/64 117/76  Pulse: 69 77 69 74  Temp:      TempSrc:      Resp: 16 16 20 20   Height:      Weight:         Dilation: Fingertip Effacement (%): 20, 30 Cervical Position: Posterior Station: -3 Presentation: Vertex Exam by:: Devra DoppAmber Knox, RN   Labs: Lab Results  Component Value Date   WBC 7.2 10/25/2015   HGB 9.7* 10/25/2015   HCT 30.5* 10/25/2015   MCV 77.8* 10/25/2015   PLT 143* 10/25/2015    Patient Active Problem List   Diagnosis Date Noted  . Post term pregnancy 10/25/2015    Assessment / Plan: 35 y.o. [id number] at 4115w0d here for IOL for post dates.  Labor: s/p cytotec Fetal Wellbeing:  Moderate variability, accels, cat 1 Pain Control:  IV meds  Anticipated MOD:  SVD  Jillian MuseKate Nissim Fleischer, MD 10/25/2015, 10:37 AM

## 2015-10-26 LAB — CBC
HEMATOCRIT: 32.2 % — AB (ref 36.0–46.0)
HEMOGLOBIN: 10 g/dL — AB (ref 12.0–15.0)
MCH: 24.4 pg — ABNORMAL LOW (ref 26.0–34.0)
MCHC: 31.1 g/dL (ref 30.0–36.0)
MCV: 78.5 fL (ref 78.0–100.0)
Platelets: 128 10*3/uL — ABNORMAL LOW (ref 150–400)
RBC: 4.1 MIL/uL (ref 3.87–5.11)
RDW: 16.3 % — ABNORMAL HIGH (ref 11.5–15.5)
WBC: 10.1 10*3/uL (ref 4.0–10.5)

## 2015-10-26 LAB — RPR: RPR: NONREACTIVE

## 2015-10-26 NOTE — Progress Notes (Signed)
CSW acknowledged consult for hx of adoption.  CSW screen out; Per chart review no indication for CSW consult.   Please contact the Clinical Social Worker if needs arise, or if MOB requests.

## 2015-10-26 NOTE — Progress Notes (Signed)
Post Partum Day 1  Subjective:  Simonne Martineterrie Kilgallon is a 35 y.o. [id number] 5639w0d s/p SVD. Patient was induced for post dates.  No acute events overnight.  Pt denies problems with ambulating, voiding or po intake.  She denies nausea or vomiting.  Pain is well controlled.  She has had flatus. She has not had bowel movement.  Lochia Small.  Plan for birth control is IUD.  Method of Feeding: breast  Objective: BP 112/67 mmHg  Pulse 45  Temp(Src) 97.8 F (36.6 C) (Oral)  Resp 18  Ht 5\' 6"  (1.676 m)  Wt 86.183 kg (190 lb)  BMI 30.68 kg/m2  SpO2 99%  LMP 12/28/2014  Breastfeeding? Unknown  Physical Exam:  General: alert, cooperative and no distress Lochia:normal flow Chest: CTAB Heart: RRR no m/r/g Abdomen: +BS, soft, nontender, fundus firm at/below umbilicus Uterine Fundus: firm DVT Evaluation: No evidence of DVT seen on physical exam. Extremities: no edema   Recent Labs  10/25/15 0045 10/26/15 0529  HGB 9.7* 10.0*  HCT 30.5* 32.2*    Assessment/Plan:  ASSESSMENT: Simonne Martineterrie Ned is a 35 y.o. [id number] 6439w0d ppd #1 s/p NSVD after IOL for PD doing well.   Plan for discharge tomorrow   LOS: 1 day   Palma HolterKanishka G Gunadasa 10/26/2015, 7:25 AM

## 2015-10-26 NOTE — Lactation Note (Signed)
This note was copied from a baby's chart. Lactation Consultation Note Experienced BF mom, youngest child 513 yrs old, mom Bf her 2 months. Bf the other children a few months as well. Is breast/formula. Stated baby wouldn't latch for last attempt to BF. Baby spitty. Abd. Slightly distended. Mom encouraged to feed baby 8-12 times/24 hours and with feeding cues.  Educated about newborn behavior , I&O, STS. Reviewed Baby & Me book's Breastfeeding Basics. WH/LC brochure given w/resources, support groups and LC services. Patient Name: Jillian Barr Reason for consult: Initial assessment   Maternal Data Has patient been taught Hand Expression?: Yes Does the patient have breastfeeding experience prior to this delivery?: Yes  Feeding    LATCH Score/Interventions Latch: Too sleepy or reluctant, no latch achieved, no sucking elicited.              Intervention(s): Skin to skin;Position options;Support Pillows;Breastfeeding basics reviewed     Lactation Tools Discussed/Used WIC Program: Yes   Consult Status Consult Status: Follow-up Date: 10/27/15 Follow-up type: In-patient    Charyl DancerCARVER, Elda Dunkerson G Barr, 4:14 AM

## 2015-10-26 NOTE — Anesthesia Postprocedure Evaluation (Signed)
Anesthesia Post Note  Patient: Jillian Barr  Procedure(s) Performed: * No procedures listed *  Patient location during evaluation: Mother Baby Anesthesia Type: Epidural Level of consciousness: oriented and awake and alert Pain management: pain level controlled Vital Signs Assessment: post-procedure vital signs reviewed and stable Respiratory status: spontaneous breathing and nonlabored ventilation Cardiovascular status: stable Postop Assessment: epidural receding, patient able to bend at knees, no signs of nausea or vomiting and adequate PO intake Anesthetic complications: no     Last Vitals:  Filed Vitals:   10/26/15 0010 10/26/15 0613  BP: 119/63 112/67  Pulse: 64 45  Temp: 37.2 C 36.6 C  Resp: 18 18    Last Pain:  Filed Vitals:   10/26/15 0614  PainSc: 1    Pain Goal: Patients Stated Pain Goal: 5 (10/25/15 0739)               Laban EmperorMalinova,Yanis Larin Hristova

## 2015-10-27 MED ORDER — PRENATAL MULTIVITAMIN CH: 1.0000 | ORAL_TABLET | Freq: Every day | ORAL | Status: AC

## 2015-10-27 MED ORDER — IBUPROFEN 600 MG PO TABS: 600.0000 mg | ORAL_TABLET | Freq: Four times a day (QID) | ORAL | Status: AC

## 2015-10-27 MED ORDER — ACETAMINOPHEN 325 MG PO TABS: 650.0000 mg | ORAL_TABLET | ORAL | Status: AC | PRN

## 2015-10-27 NOTE — Progress Notes (Signed)
Post Partum Day 2 Subjective: no complaints, up ad lib, voiding, tolerating PO and + flatus  Breastfeeding with little difficulty, supplementing with formula at night.  Objective: Blood pressure 122/77, pulse 48, temperature 98.3 F (36.8 C), temperature source Oral, resp. rate 18, height 5\' 6"  (1.676 m), weight 86.183 kg (190 lb), last menstrual period 12/28/2014, SpO2 99 %, unknown if currently breastfeeding.  Physical Exam:  General: alert, cooperative and no distress Lochia: appropriate Uterine Fundus: firm DVT Evaluation: No evidence of DVT seen on physical exam.   Recent Labs  10/25/15 0045 10/26/15 0529  HGB 9.7* 10.0*  HCT 30.5* 32.2*    Assessment/Plan: PPD#2. Desires to go home Contraception: IUD   LOS: 2 days   Jillian Barr E [place] 10/27/2015, 7:44 AM

## 2015-10-27 NOTE — Lactation Note (Signed)
This note was copied from a baby's chart. Lactation Consultation Note  Patient Name: Jillian Barr [id number] Date: 10/27/2015 Reason for consult: Follow-up assessment Baby just coming off the right breast, sleepy. Mom reports baby was cluster feeding last night. Demonstrated burping baby to Mom, baby woke up and Mom latched to left breast. Few good suckling bursts observed, sleepy at the breast. Basic teaching reviewed with Mom. Advised baby should be at breast on demand, at least 8-12 times in 24 hours. Monitor voids/stool, refer to Baby N Me booklet, page 24. Try to keep baby nursing for 15-20 minutes both breasts most feedings. Engorgement care reviewed if needed. Mom has DEBP at home but needs new pump pieces. Kit given to Mom, reviewed cleaning and how to use hand pump. Advised if supplementing to post pump after feedings for 15 minutes to encourage milk production and to have EBM to supplement. Advised of OP services and support group. Encouraged to call for questions/concerns.   Maternal Data    Feeding Feeding Type: Breast Fed Length of feed: 10 min  LATCH Score/Interventions Latch: Grasps breast easily, tongue down, lips flanged, rhythmical sucking. Intervention(s): Adjust position;Assist with latch;Breast massage  Audible Swallowing: A few with stimulation  Type of Nipple: Everted at rest and after stimulation  Comfort (Breast/Nipple): Filling, red/small blisters or bruises, mild/mod discomfort  Problem noted: Mild/Moderate discomfort (small blister left nipple) Interventions (Mild/moderate discomfort): Hand expression (advised EBM for nipple tenderness, coconut oil prn)  Hold (Positioning): Assistance needed to correctly position infant at breast and maintain latch. Intervention(s): Breastfeeding basics reviewed;Support Pillows;Position options;Skin to skin  LATCH Score: 7  Lactation Tools Discussed/Used     Consult Status Consult Status: Complete Date:  10/27/15 Follow-up type: In-patient    [name] 10/27/2015, 11:20 AM

## 2015-10-27 NOTE — Discharge Summary (Signed)
OB Discharge Summary     Patient Name: Jillian Barr DOB: [birth year] MRN: [id number]  Date of admission: 10/25/2015 Delivering MD: Garth BignessIMBERLAKE, KATHRYN   Date of discharge: 10/27/2015  Admitting diagnosis: INDUCTION Intrauterine pregnancy: 8418w0d     Secondary diagnosis:  Active Problems:   Post term pregnancy  Additional problems: none     Discharge diagnosis: Term Pregnancy Delivered                                                                                                Post partum procedures:none  Augmentation: Pitocin and Cytotec  Complications: None  Hospital course:  Induction of Labor With Vaginal Delivery   35 y.o. yo [id number] at 7018w0d was admitted to the hospital 10/25/2015 for induction of labor.  Indication for induction: Postdates.  Patient had an uncomplicated labor course as follows: Membrane Rupture Time/Date: 3:57 PM ,10/25/2015   Intrapartum Procedures: Episiotomy: None [1]                                         Lacerations:  1st degree [2];Labial [10]  Patient had delivery of a Viable infant.  Information for the patient's newborn:  Jillian Barr, Boy [name] [[id number]]  Delivery Method: Vaginal, Spontaneous Delivery (Filed from Delivery Summary)   10/25/2015  Details of delivery can be found in separate delivery note.  Patient had a routine postpartum course. Patient is discharged home 10/27/2015.   Physical exam  Filed Vitals:   10/26/15 0613 10/26/15 0805 10/26/15 1900 10/27/15 0519  BP: 112/67 105/67 117/69 122/77  Pulse: 45 59 48 48  Temp: 97.8 F (36.6 C) 97.8 F (36.6 C) 98 F (36.7 C) 98.3 F (36.8 C)  TempSrc:  Oral Oral Oral  Resp: 18 18 18 18   Height:      Weight:      SpO2:  99%     General: alert, cooperative and no distress Lochia: appropriate Uterine Fundus: firm Incision: N/A DVT Evaluation: No cords or calf tenderness. No significant calf/ankle edema. Labs: Lab Results  Component Value Date   WBC 10.1 10/26/2015   HGB  10.0* 10/26/2015   HCT 32.2* 10/26/2015   MCV 78.5 10/26/2015   PLT 128* 10/26/2015   CMP Latest Ref Rng 05/28/2015  Glucose 65 - 99 mg/dL 130(Q111(H)  BUN 6 - 20 mg/dL 9  Creatinine 6.570.44 - 8.461.00 mg/dL 9.620.49  Sodium 952135 - 841145 mmol/L 135  Potassium 3.5 - 5.1 mmol/L 3.7  Chloride 101 - 111 mmol/L 105  CO2 22 - 32 mmol/L 25  Calcium 8.9 - 10.3 mg/dL 8.2(L)  Total Protein 6.5 - 8.1 g/dL 6.4(L)  Total Bilirubin 0.3 - 1.2 mg/dL 0.3  Alkaline Phos 38 - 126 U/L 39  AST 15 - 41 U/L 19  ALT 14 - 54 U/L 12(L)    Discharge instruction: per After Visit Summary and "Baby and Me Booklet".  After visit meds:    Medication List    STOP taking these medications  ferrous sulfate 325 (65 FE) MG tablet      TAKE these medications        acetaminophen 325 MG tablet  Commonly known as:  TYLENOL  Take 2 tablets (650 mg total) by mouth every 4 (four) hours as needed (for pain scale < 4).     ibuprofen 600 MG tablet  Commonly known as:  ADVIL,MOTRIN  Take 1 tablet (600 mg total) by mouth every 6 (six) hours.     prenatal multivitamin Tabs tablet  Take 1 tablet by mouth daily at 12 noon.        Diet: routine diet  Activity: Advance as tolerated. Pelvic rest for 6 weeks.   Outpatient follow up:6 weeks Follow up Appt:No future appointments. Follow up Visit:No Follow-up on file.  Postpartum contraception: IUD Mirena  Newborn Data: Live born female  Birth Weight: 8 lb 13.5 oz (4010 g) APGAR: 8, 9  Baby Feeding: Breast Disposition:home with mother     10/27/2015 Silvano Bilis, MD

## 2015-10-27 NOTE — Discharge Instructions (Signed)

## 2016-11-14 IMAGING — US US MFM FETAL NUCHAL TRANSLUCENCY
1 series · 15 of 28 positions shown · non-contrast
Comparison: none

[Series 1: us mfm fetal nuchal translucency · 15 of 37 slices shown]
[im 1/37]
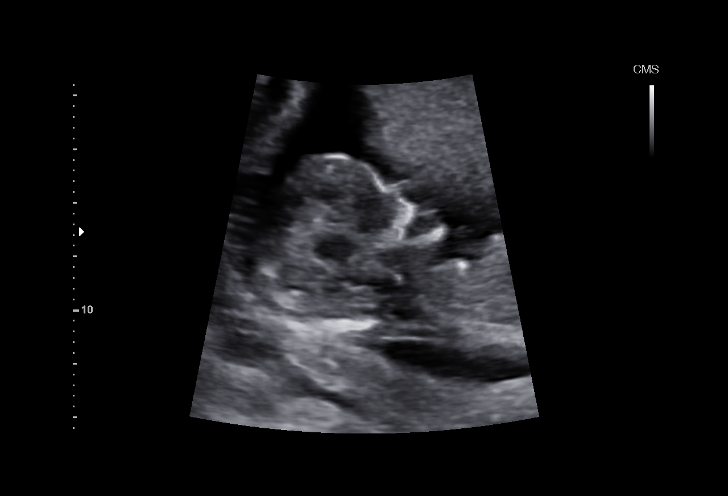
[im 3/37]
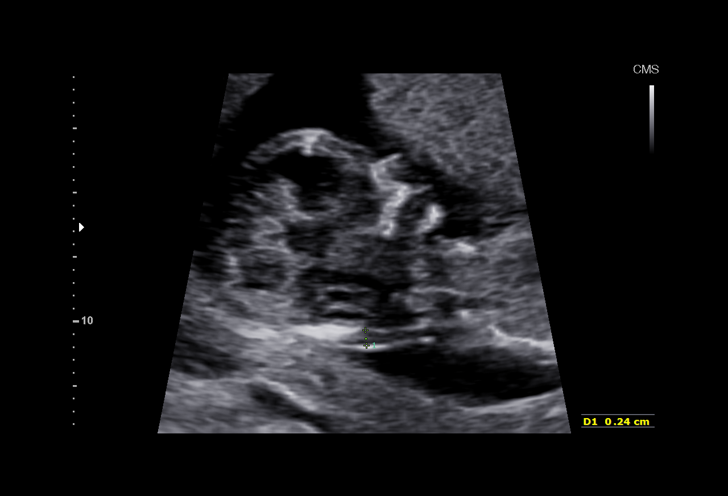
[im 6/37]
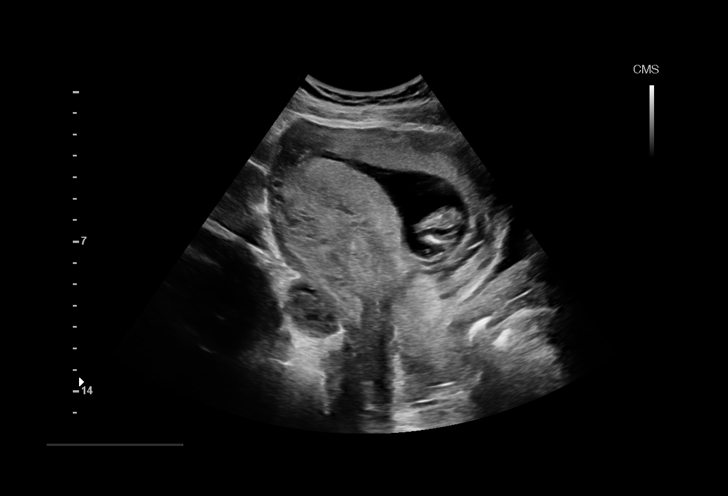
[im 9/37]
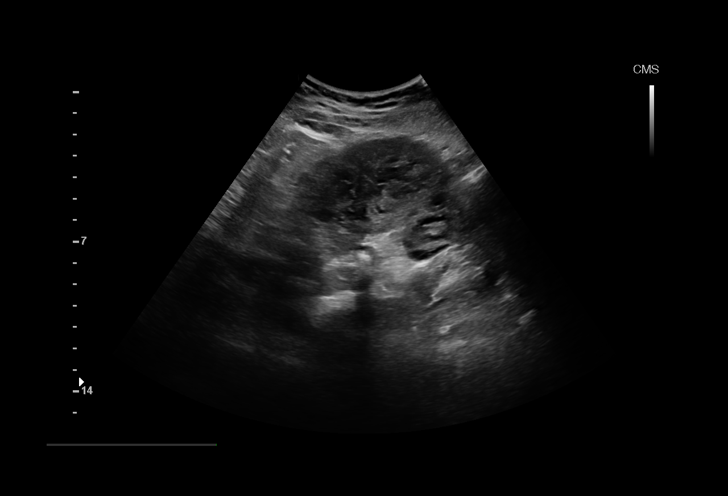
[im 11/37]
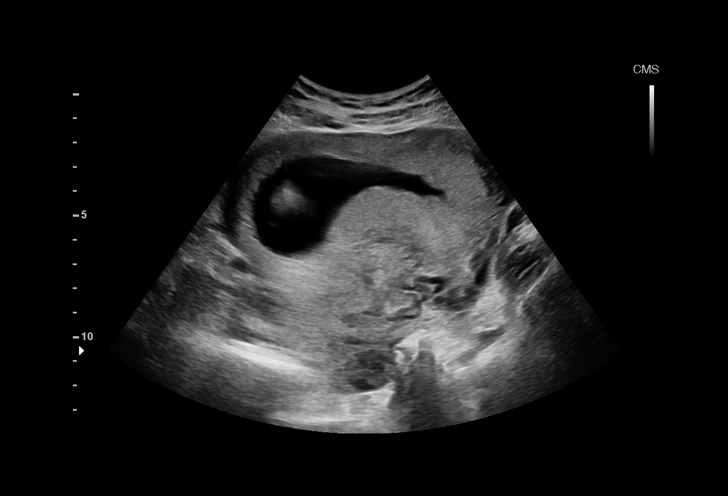
[im 14/37]
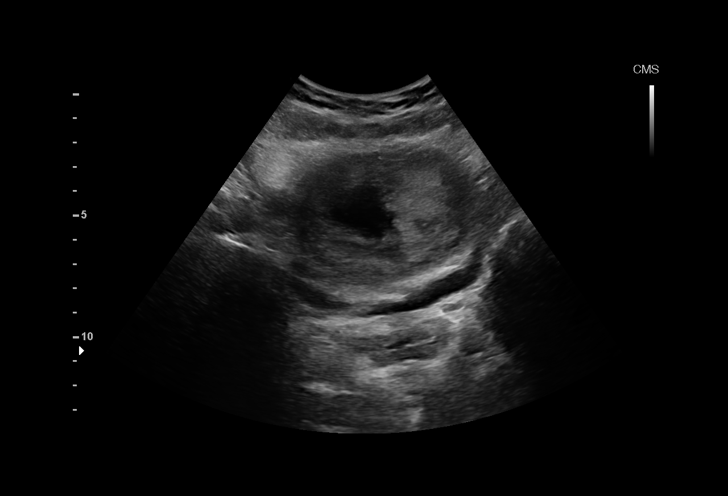
[im 17/37]
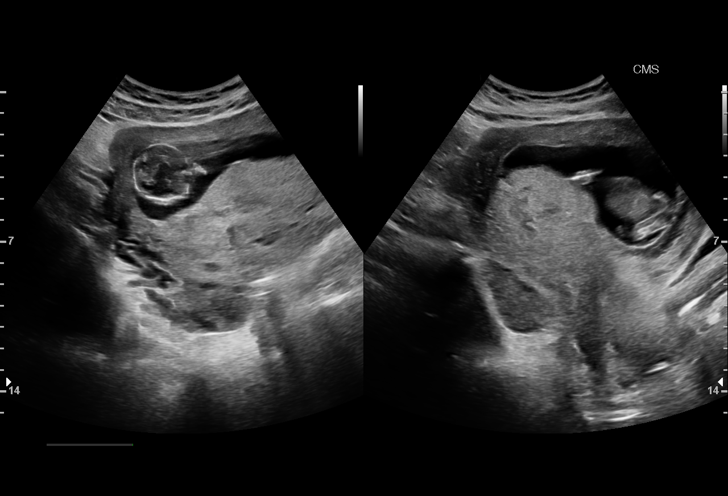
[im 19/37]
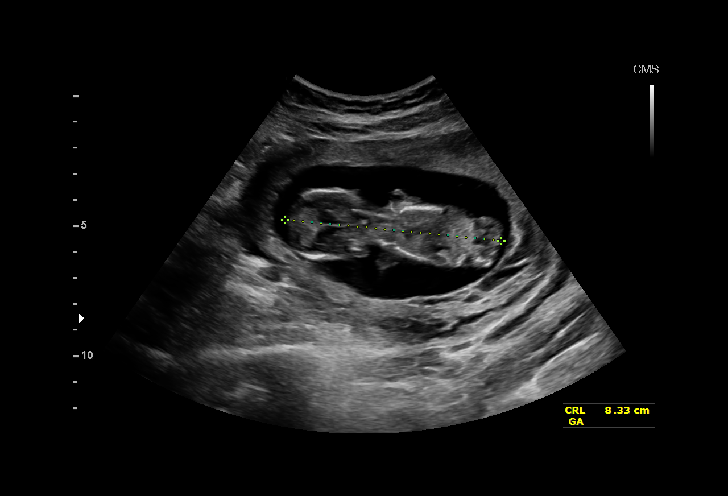
[im 21/37]
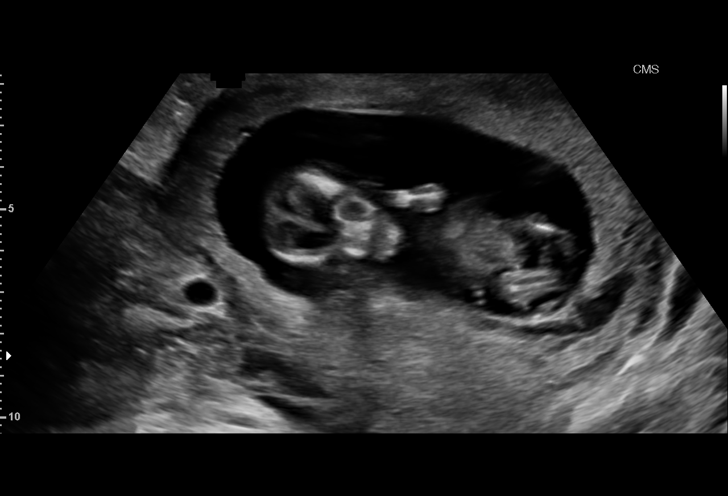
[im 23/37]
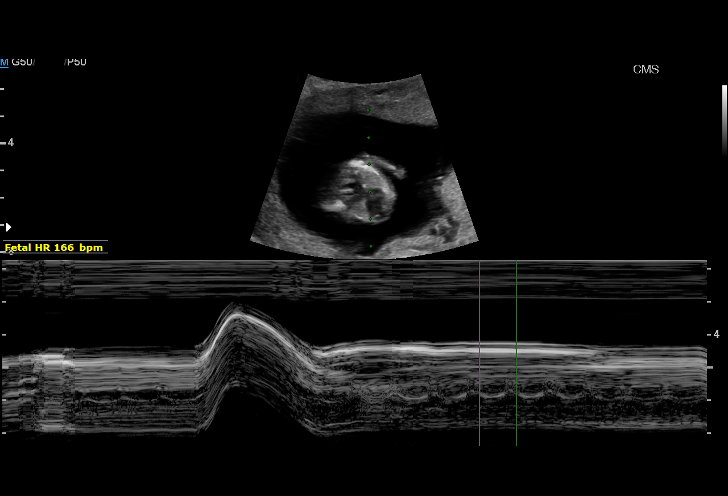
[im 26/37]
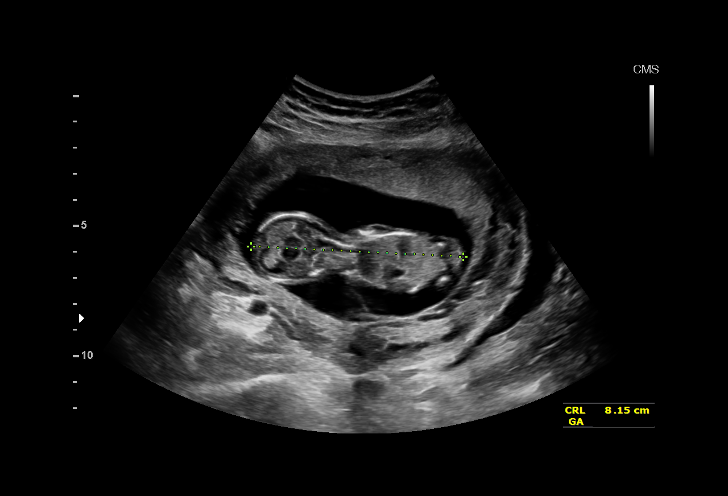
[im 29/37]
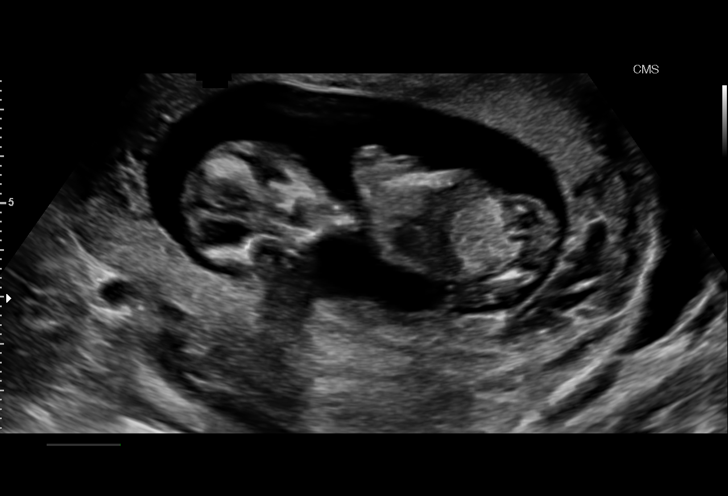
[im 31/37]
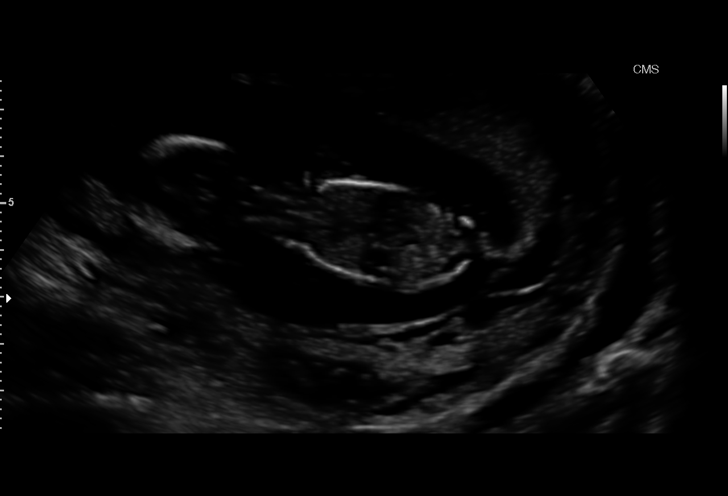
[im 34/37]
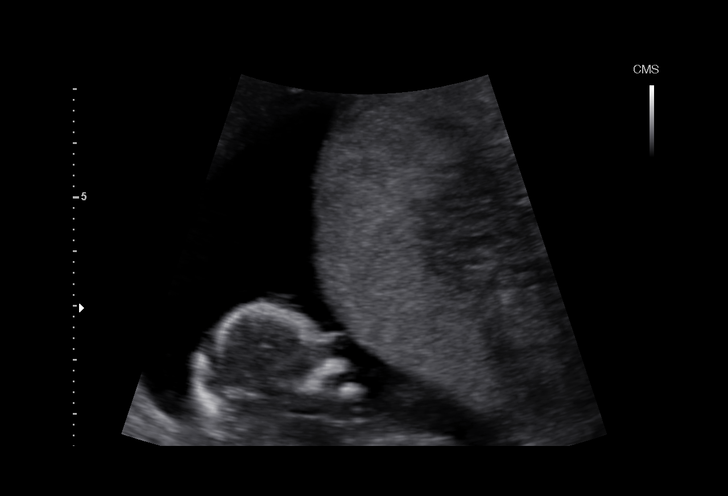
[im 37/37]
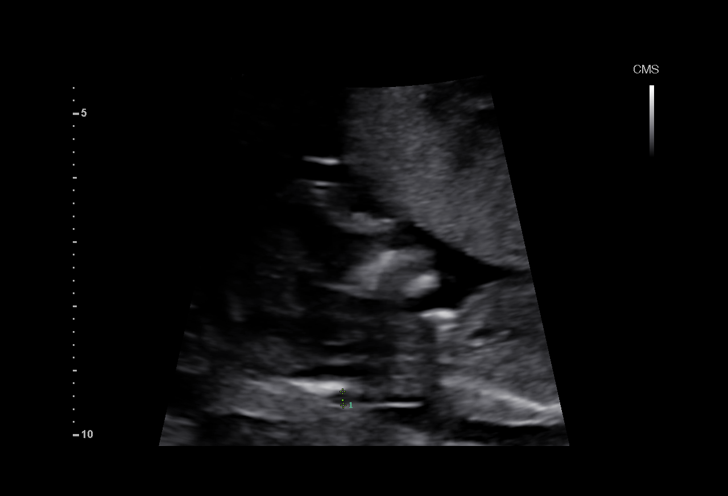

[15 of 28 positions shown; findings below may reference images not displayed]

AUJLA
NP

TRANSLUCENCY

1  SAKETH SITTON          [id number]      [id number]     [id number]
BROWN
Indications

13 weeks gestation of pregnancy
First trimester aneuploidy screen (NT)         Z36
OB History

Gravidity:    6         Term:   4        Prem:   0         SAB:   1
TOP:          0       Ectopic:  0        Living: 4
Fetal Evaluation

Num Of Fetuses:     1
Preg. Location:     Intrauterine
Gest. Sac:          Intrauterine
Fetal Pole:         Visualized
Fetal Heart         166
Rate(bpm):
Cardiac Activity:   Observed
Gestational Age

LMP:           15w 4d        Date:  12/28/14                 EDD:    10/04/15
Best:          13w 4d     Det. By:  Early Ultrasound         EDD:    10/18/15
(04/05/15)
1st Trimester Genetic Sonogram Screening

CRL:            81.5  mm    G. Age:   13w 5d                 EDD:    10/17/15
Nuc Trans:       2.2  mm
Nasal Bone:                 Present
Anatomy

Stomach:          Appears normal, left   Bladder:          Appears normal
sided
Cervix Uterus Adnexa

Uterus
No abnormality visualized.

Left Ovary
Within normal limits.

Right Ovary
Within normal limits.

Cul De Sac:   No free fluid seen.

Adnexa:       No abnormality visualized.
Impression

SIUP at 13+4 weeks
No gross abnormalities identified
NT measurement was within normal limits for this GA; NB
present
Normal amniotic fluid volume
Measurements consistent with early US
Recommendations

Offer MSAFP in the second trimester for ONTD screening
Offer anatomy U/S by 18 weeks
